# Patient Record
Sex: Male | Born: 1945 | Race: White | Hispanic: No | Marital: Married | Smoking: Never smoker
Health system: Southern US, Community
[De-identification: ages and names within clinical notes are randomized; demographics above are authoritative.]

---

## 2010-01-04 ENCOUNTER — Emergency Department (HOSPITAL_BASED_OUTPATIENT_CLINIC_OR_DEPARTMENT_OTHER): Admission: EM | Admit: 2010-01-04 | Discharge: 2010-01-04 | Payer: Self-pay | Admitting: Emergency Medicine

## 2010-01-04 ENCOUNTER — Ambulatory Visit: Payer: Self-pay | Admitting: Diagnostic Radiology

## 2010-01-09 ENCOUNTER — Emergency Department (HOSPITAL_BASED_OUTPATIENT_CLINIC_OR_DEPARTMENT_OTHER): Admission: EM | Admit: 2010-01-09 | Discharge: 2010-01-09 | Payer: Self-pay | Admitting: Emergency Medicine

## 2013-11-24 ENCOUNTER — Ambulatory Visit (INDEPENDENT_AMBULATORY_CARE_PROVIDER_SITE_OTHER): Payer: Medicare Other | Admitting: Sports Medicine

## 2013-11-24 ENCOUNTER — Encounter: Payer: Self-pay | Admitting: Sports Medicine

## 2013-11-24 ENCOUNTER — Ambulatory Visit (INDEPENDENT_AMBULATORY_CARE_PROVIDER_SITE_OTHER): Payer: Medicare Other

## 2013-11-24 VITALS — BP 110/73 | HR 63 | Ht 67.5 in | Wt 183.0 lb

## 2013-11-24 DIAGNOSIS — L03213 Periorbital cellulitis: Secondary | ICD-10-CM

## 2013-11-24 DIAGNOSIS — Z0000 Encounter for general adult medical examination without abnormal findings: Secondary | ICD-10-CM

## 2013-11-24 DIAGNOSIS — L03211 Cellulitis of face: Secondary | ICD-10-CM

## 2013-11-24 DIAGNOSIS — H00036 Abscess of eyelid left eye, unspecified eyelid: Secondary | ICD-10-CM

## 2013-11-24 DIAGNOSIS — H0520 Unspecified exophthalmos: Secondary | ICD-10-CM

## 2013-11-24 DIAGNOSIS — H269 Unspecified cataract: Secondary | ICD-10-CM | POA: Insufficient documentation

## 2013-11-24 DIAGNOSIS — R1030 Lower abdominal pain, unspecified: Secondary | ICD-10-CM

## 2013-11-24 DIAGNOSIS — N400 Enlarged prostate without lower urinary tract symptoms: Secondary | ICD-10-CM

## 2013-11-24 DIAGNOSIS — R1032 Left lower quadrant pain: Secondary | ICD-10-CM | POA: Insufficient documentation

## 2013-11-24 MED ORDER — DOXYCYCLINE HYCLATE 100 MG PO TABS: 100.0000 mg | ORAL_TABLET | Freq: Two times a day (BID) | ORAL | Status: AC

## 2013-11-24 NOTE — Assessment & Plan Note (Signed)
-   Continue Flomax 

## 2013-11-24 NOTE — Assessment & Plan Note (Signed)
Routine blood work ordered. Return in January for Medicare physical.

## 2013-11-24 NOTE — Assessment & Plan Note (Signed)
Vision is okay. There is only mild proptosis however we are still going to obtain a CT of the head to rule out orbital cellulitis. Doxycycline for 14 days. Return in 3 weeks.

## 2013-11-24 NOTE — Assessment & Plan Note (Signed)
Has an appointment in November for ophthalmology

## 2013-11-24 NOTE — Progress Notes (Signed)
  Subjective:    CC: Establish care.   HPI:  Left eye swelling: Present for a couple of weeks, slightly painful, no visual changes. It was slightly erythematous but now has started to resolve on its own.  BPH: Well controlled on Flomax.  Cataracts: Has an appointment scheduled with ophthalmology in November for excision.  Left groin pain: Unclear diagnosis, he has been on antibiotics in the past that resolved this. Initially he thought he had a hernia amenable to discussing this in further detail at a future visit.  Past medical history, Surgical history, Family history not pertinant except as noted below, Social history, Allergies, and medications have been entered into the medical record, reviewed, and no changes needed.   Review of Systems: No headache, visual changes, nausea, vomiting, diarrhea, constipation, dizziness, abdominal pain, skin rash, fevers, chills, night sweats, swollen lymph nodes, weight loss, chest pain, body aches, joint swelling, muscle aches, shortness of breath, mood changes, visual or auditory hallucinations.  Objective:    General: Well Developed, well nourished, and in no acute distress.  Neuro: Alert and oriented x3, extra-ocular muscles intact, sensation grossly intact.  HEENT: Normocephalic, atraumatic, pupils equal round reactive to light, neck supple, no masses, no lymphadenopathy, thyroid nonpalpable. There is only mild proptosis with essentially no erythema around the left eye, extraocular muscles intact, no pain on compression of the globe, vision is grossly intact. Skin: Warm and dry, no rashes noted.  Cardiac: Regular rate and rhythm, no murmurs rubs or gallops.  Respiratory: Clear to auscultation bilaterally. Not using accessory muscles, speaking in full sentences.  Abdominal: Soft, nontender, nondistended, positive bowel sounds, no masses, no organomegaly.  Musculoskeletal: Shoulder, elbow, wrist, hip, knee, ankle stable, and with full range of  motion.  Impression and Recommendations:    The patient was counselled, risk factors were discussed, anticipatory guidance given.

## 2013-11-24 NOTE — Assessment & Plan Note (Signed)
Recheck in 3 weeks.

## 2013-11-25 ENCOUNTER — Other Ambulatory Visit: Payer: Self-pay | Admitting: Sports Medicine

## 2013-11-26 LAB — COMPREHENSIVE METABOLIC PANEL
ALT: 22 U/L (ref 0–53)
Albumin: 4.6 g/dL (ref 3.5–5.2)
CO2: 25 mEq/L (ref 19–32)
Calcium: 9.1 mg/dL (ref 8.4–10.5)
Chloride: 106 mEq/L (ref 96–112)
Sodium: 140 mEq/L (ref 135–145)
Total Protein: 7.2 g/dL (ref 6.0–8.3)

## 2013-11-26 LAB — LIPID PANEL
Cholesterol: 140 mg/dL (ref 0–200)
HDL: 64 mg/dL (ref >39)
LDL Cholesterol: 66 mg/dL (ref 0–99)
Total CHOL/HDL Ratio: 2.2 ratio
Triglycerides: 49 mg/dL (ref <150)
VLDL: 10 mg/dL (ref 0–40)

## 2013-11-26 LAB — CBC WITH DIFFERENTIAL/PLATELET
Basophils Absolute: 0 K/uL (ref 0.0–0.1)
Basophils Relative: 0 % (ref 0–1)
Eosinophils Absolute: 0.2 K/uL (ref 0.0–0.7)
Eosinophils Relative: 4 % (ref 0–5)
HCT: 45.5 % (ref 39.0–52.0)
Hemoglobin: 15.5 g/dL (ref 13.0–17.0)
Lymphocytes Relative: 24 % (ref 12–46)
Lymphs Abs: 1.4 10*3/uL (ref 0.7–4.0)
MCH: 30.3 pg (ref 26.0–34.0)
MCHC: 34.1 g/dL (ref 30.0–36.0)
MCV: 89 fL (ref 78.0–100.0)
Monocytes Absolute: 0.6 K/uL (ref 0.1–1.0)
Monocytes Relative: 10 % (ref 3–12)
Neutro Abs: 3.6 K/uL (ref 1.7–7.7)
Neutrophils Relative %: 62 % (ref 43–77)
Platelets: 340 10*3/uL (ref 150–400)
RBC: 5.11 MIL/uL (ref 4.22–5.81)
RDW: 12.7 % (ref 11.5–15.5)
WBC: 5.8 10*3/uL (ref 4.0–10.5)

## 2013-11-26 LAB — COMPREHENSIVE METABOLIC PANEL WITH GFR
AST: 22 U/L (ref 0–37)
Alkaline Phosphatase: 56 U/L (ref 39–117)
BUN: 13 mg/dL (ref 6–23)
Creat: 1.04 mg/dL (ref 0.50–1.35)
Glucose, Bld: 92 mg/dL (ref 70–99)
Potassium: 4.4 meq/L (ref 3.5–5.3)
Total Bilirubin: 1.6 mg/dL — ABNORMAL HIGH (ref 0.2–1.2)

## 2013-11-26 LAB — TSH: TSH: 2.616 u[IU]/mL (ref 0.350–4.500)

## 2013-11-26 LAB — PSA, TOTAL AND FREE
PSA, Free Pct: 33 % (ref >25)
PSA, Free: 0.84 ng/mL
PSA: 2.58 ng/mL (ref <=4.00)

## 2013-11-26 LAB — HEMOGLOBIN A1C
Hgb A1c MFr Bld: 5.4 % (ref <5.7)
Mean Plasma Glucose: 108 mg/dL (ref <117)

## 2013-12-19 ENCOUNTER — Encounter: Payer: Self-pay | Admitting: Sports Medicine

## 2013-12-19 ENCOUNTER — Ambulatory Visit (INDEPENDENT_AMBULATORY_CARE_PROVIDER_SITE_OTHER): Payer: Medicare Other | Admitting: Sports Medicine

## 2013-12-19 VITALS — BP 116/70 | HR 76 | Ht 67.0 in | Wt 184.0 lb

## 2013-12-19 DIAGNOSIS — H00036 Abscess of eyelid left eye, unspecified eyelid: Secondary | ICD-10-CM | POA: Diagnosis not present

## 2013-12-19 DIAGNOSIS — Z0000 Encounter for general adult medical examination without abnormal findings: Secondary | ICD-10-CM

## 2013-12-19 DIAGNOSIS — R1030 Lower abdominal pain, unspecified: Secondary | ICD-10-CM | POA: Diagnosis not present

## 2013-12-19 DIAGNOSIS — L03213 Periorbital cellulitis: Secondary | ICD-10-CM

## 2013-12-19 DIAGNOSIS — R1032 Left lower quadrant pain: Secondary | ICD-10-CM

## 2013-12-19 NOTE — Assessment & Plan Note (Signed)
Doing very well. Labs are perfect. Return in January for Medicare physical.

## 2013-12-19 NOTE — Assessment & Plan Note (Signed)
Clinically resolved.  

## 2013-12-19 NOTE — Progress Notes (Signed)
  Subjective:    CC: Follow-up  HPI: Austin Farrell returns, he is up-to-date on all screening measures, he would like to go over his blood work today.  Left groin pain: Continuously resolving.  Preseptal cellulitis: Resolved.  Past medical history, Surgical history, Family history not pertinant except as noted below, Social history, Allergies, and medications have been entered into the medical record, reviewed, and no changes needed.   Review of Systems: No fevers, chills, night sweats, weight loss, chest pain, or shortness of breath.   Objective:    General: Well Developed, well nourished, and in no acute distress.  Neuro: Alert and oriented x3, extra-ocular muscles intact, sensation grossly intact.  HEENT: Normocephalic, atraumatic, pupils equal round reactive to light, neck supple, no masses, no lymphadenopathy, thyroid nonpalpable.  Skin: Warm and dry, no rashes. Cardiac: Regular rate and rhythm, no murmurs rubs or gallops, no lower extremity edema.  Respiratory: Clear to auscultation bilaterally. Not using accessory muscles, speaking in full sentences.  Impression and Recommendations:    I spent 25 minutes with this patient, greater than 50% was face-to-face time counseling regarding the above diagnoses, we went over all of his blood work in detail.

## 2013-12-19 NOTE — Assessment & Plan Note (Signed)
Resolving

## 2014-03-12 DIAGNOSIS — L309 Dermatitis, unspecified: Secondary | ICD-10-CM | POA: Diagnosis not present

## 2014-06-01 DIAGNOSIS — M9902 Segmental and somatic dysfunction of thoracic region: Secondary | ICD-10-CM | POA: Diagnosis not present

## 2014-06-01 DIAGNOSIS — M9903 Segmental and somatic dysfunction of lumbar region: Secondary | ICD-10-CM | POA: Diagnosis not present

## 2014-06-01 DIAGNOSIS — M546 Pain in thoracic spine: Secondary | ICD-10-CM | POA: Diagnosis not present

## 2014-06-01 DIAGNOSIS — M542 Cervicalgia: Secondary | ICD-10-CM | POA: Diagnosis not present

## 2014-06-01 DIAGNOSIS — M9901 Segmental and somatic dysfunction of cervical region: Secondary | ICD-10-CM | POA: Diagnosis not present

## 2014-06-01 DIAGNOSIS — M5441 Lumbago with sciatica, right side: Secondary | ICD-10-CM | POA: Diagnosis not present

## 2014-07-10 DIAGNOSIS — H35373 Puckering of macula, bilateral: Secondary | ICD-10-CM | POA: Diagnosis not present

## 2014-07-10 DIAGNOSIS — H02832 Dermatochalasis of right lower eyelid: Secondary | ICD-10-CM | POA: Diagnosis not present

## 2014-07-10 DIAGNOSIS — H11823 Conjunctivochalasis, bilateral: Secondary | ICD-10-CM | POA: Diagnosis not present

## 2014-07-10 DIAGNOSIS — H02834 Dermatochalasis of left upper eyelid: Secondary | ICD-10-CM | POA: Diagnosis not present

## 2014-07-10 DIAGNOSIS — H25813 Combined forms of age-related cataract, bilateral: Secondary | ICD-10-CM | POA: Diagnosis not present

## 2014-07-10 DIAGNOSIS — H02835 Dermatochalasis of left lower eyelid: Secondary | ICD-10-CM | POA: Diagnosis not present

## 2014-07-10 DIAGNOSIS — H02831 Dermatochalasis of right upper eyelid: Secondary | ICD-10-CM | POA: Diagnosis not present

## 2014-07-10 DIAGNOSIS — H43813 Vitreous degeneration, bilateral: Secondary | ICD-10-CM | POA: Diagnosis not present

## 2014-08-05 DIAGNOSIS — H2513 Age-related nuclear cataract, bilateral: Secondary | ICD-10-CM | POA: Diagnosis not present

## 2014-08-05 DIAGNOSIS — H35373 Puckering of macula, bilateral: Secondary | ICD-10-CM | POA: Diagnosis not present

## 2014-08-05 DIAGNOSIS — H25013 Cortical age-related cataract, bilateral: Secondary | ICD-10-CM | POA: Diagnosis not present

## 2014-08-20 DIAGNOSIS — R5381 Other malaise: Secondary | ICD-10-CM | POA: Diagnosis not present

## 2014-08-20 DIAGNOSIS — J309 Allergic rhinitis, unspecified: Secondary | ICD-10-CM | POA: Diagnosis not present

## 2014-08-20 DIAGNOSIS — Z0000 Encounter for general adult medical examination without abnormal findings: Secondary | ICD-10-CM | POA: Diagnosis not present

## 2014-08-20 DIAGNOSIS — N400 Enlarged prostate without lower urinary tract symptoms: Secondary | ICD-10-CM | POA: Diagnosis not present

## 2014-08-20 DIAGNOSIS — N529 Male erectile dysfunction, unspecified: Secondary | ICD-10-CM | POA: Diagnosis not present

## 2014-08-20 DIAGNOSIS — Z23 Encounter for immunization: Secondary | ICD-10-CM | POA: Diagnosis not present

## 2014-08-20 DIAGNOSIS — L723 Sebaceous cyst: Secondary | ICD-10-CM | POA: Diagnosis not present

## 2014-08-20 DIAGNOSIS — H6980 Other specified disorders of Eustachian tube, unspecified ear: Secondary | ICD-10-CM | POA: Diagnosis not present

## 2014-08-25 DIAGNOSIS — H25811 Combined forms of age-related cataract, right eye: Secondary | ICD-10-CM | POA: Diagnosis not present

## 2014-08-25 DIAGNOSIS — H25011 Cortical age-related cataract, right eye: Secondary | ICD-10-CM | POA: Diagnosis not present

## 2014-08-25 DIAGNOSIS — H2511 Age-related nuclear cataract, right eye: Secondary | ICD-10-CM | POA: Diagnosis not present

## 2014-09-12 DIAGNOSIS — S01112A Laceration without foreign body of left eyelid and periocular area, initial encounter: Secondary | ICD-10-CM | POA: Diagnosis not present

## 2014-09-12 DIAGNOSIS — S098XXA Other specified injuries of head, initial encounter: Secondary | ICD-10-CM | POA: Diagnosis not present

## 2014-09-12 DIAGNOSIS — Z23 Encounter for immunization: Secondary | ICD-10-CM | POA: Diagnosis not present

## 2014-09-12 DIAGNOSIS — S0990XA Unspecified injury of head, initial encounter: Secondary | ICD-10-CM | POA: Diagnosis not present

## 2014-09-12 DIAGNOSIS — S0181XA Laceration without foreign body of other part of head, initial encounter: Secondary | ICD-10-CM | POA: Diagnosis not present

## 2014-09-12 DIAGNOSIS — R51 Headache: Secondary | ICD-10-CM | POA: Diagnosis not present

## 2014-09-17 DIAGNOSIS — Z4802 Encounter for removal of sutures: Secondary | ICD-10-CM | POA: Diagnosis not present

## 2014-09-17 DIAGNOSIS — S0181XA Laceration without foreign body of other part of head, initial encounter: Secondary | ICD-10-CM | POA: Diagnosis not present

## 2014-09-22 DIAGNOSIS — H25812 Combined forms of age-related cataract, left eye: Secondary | ICD-10-CM | POA: Diagnosis not present

## 2014-09-22 DIAGNOSIS — H25012 Cortical age-related cataract, left eye: Secondary | ICD-10-CM | POA: Diagnosis not present

## 2014-09-22 DIAGNOSIS — H2512 Age-related nuclear cataract, left eye: Secondary | ICD-10-CM | POA: Diagnosis not present

## 2014-11-17 DIAGNOSIS — H02831 Dermatochalasis of right upper eyelid: Secondary | ICD-10-CM | POA: Diagnosis not present

## 2014-11-17 DIAGNOSIS — Z961 Presence of intraocular lens: Secondary | ICD-10-CM | POA: Diagnosis not present

## 2014-11-17 DIAGNOSIS — H11823 Conjunctivochalasis, bilateral: Secondary | ICD-10-CM | POA: Diagnosis not present

## 2014-11-17 DIAGNOSIS — H02832 Dermatochalasis of right lower eyelid: Secondary | ICD-10-CM | POA: Diagnosis not present

## 2014-11-17 DIAGNOSIS — H43813 Vitreous degeneration, bilateral: Secondary | ICD-10-CM | POA: Diagnosis not present

## 2014-11-17 DIAGNOSIS — H02834 Dermatochalasis of left upper eyelid: Secondary | ICD-10-CM | POA: Diagnosis not present

## 2014-11-17 DIAGNOSIS — H02403 Unspecified ptosis of bilateral eyelids: Secondary | ICD-10-CM | POA: Diagnosis not present

## 2014-11-17 DIAGNOSIS — H35373 Puckering of macula, bilateral: Secondary | ICD-10-CM | POA: Diagnosis not present

## 2014-11-17 DIAGNOSIS — H02835 Dermatochalasis of left lower eyelid: Secondary | ICD-10-CM | POA: Diagnosis not present

## 2014-11-30 DIAGNOSIS — Z23 Encounter for immunization: Secondary | ICD-10-CM | POA: Diagnosis not present

## 2015-01-25 DIAGNOSIS — Z7982 Long term (current) use of aspirin: Secondary | ICD-10-CM | POA: Diagnosis not present

## 2015-01-25 DIAGNOSIS — Z961 Presence of intraocular lens: Secondary | ICD-10-CM | POA: Diagnosis not present

## 2015-01-25 DIAGNOSIS — Z7951 Long term (current) use of inhaled steroids: Secondary | ICD-10-CM | POA: Diagnosis not present

## 2015-01-25 DIAGNOSIS — N400 Enlarged prostate without lower urinary tract symptoms: Secondary | ICD-10-CM | POA: Diagnosis not present

## 2015-01-25 DIAGNOSIS — Z9841 Cataract extraction status, right eye: Secondary | ICD-10-CM | POA: Diagnosis not present

## 2015-01-25 DIAGNOSIS — L308 Other specified dermatitis: Secondary | ICD-10-CM | POA: Diagnosis not present

## 2015-01-25 DIAGNOSIS — Z9842 Cataract extraction status, left eye: Secondary | ICD-10-CM | POA: Diagnosis not present

## 2015-01-25 DIAGNOSIS — H35371 Puckering of macula, right eye: Secondary | ICD-10-CM | POA: Diagnosis not present

## 2015-01-25 DIAGNOSIS — J302 Other seasonal allergic rhinitis: Secondary | ICD-10-CM | POA: Diagnosis not present

## 2015-01-25 DIAGNOSIS — Z79899 Other long term (current) drug therapy: Secondary | ICD-10-CM | POA: Diagnosis not present

## 2015-01-26 DIAGNOSIS — Z9889 Other specified postprocedural states: Secondary | ICD-10-CM | POA: Diagnosis not present

## 2015-01-26 DIAGNOSIS — Z961 Presence of intraocular lens: Secondary | ICD-10-CM | POA: Diagnosis not present

## 2015-01-26 DIAGNOSIS — H35373 Puckering of macula, bilateral: Secondary | ICD-10-CM | POA: Diagnosis not present

## 2015-02-02 DIAGNOSIS — H02836 Dermatochalasis of left eye, unspecified eyelid: Secondary | ICD-10-CM | POA: Diagnosis not present

## 2015-02-02 DIAGNOSIS — Z961 Presence of intraocular lens: Secondary | ICD-10-CM | POA: Diagnosis not present

## 2015-02-02 DIAGNOSIS — H11823 Conjunctivochalasis, bilateral: Secondary | ICD-10-CM | POA: Diagnosis not present

## 2015-02-02 DIAGNOSIS — H02833 Dermatochalasis of right eye, unspecified eyelid: Secondary | ICD-10-CM | POA: Diagnosis not present

## 2015-02-02 DIAGNOSIS — H02403 Unspecified ptosis of bilateral eyelids: Secondary | ICD-10-CM | POA: Diagnosis not present

## 2015-02-02 DIAGNOSIS — H35373 Puckering of macula, bilateral: Secondary | ICD-10-CM | POA: Diagnosis not present

## 2015-02-02 DIAGNOSIS — H43393 Other vitreous opacities, bilateral: Secondary | ICD-10-CM | POA: Diagnosis not present

## 2015-02-08 DIAGNOSIS — K1121 Acute sialoadenitis: Secondary | ICD-10-CM | POA: Diagnosis not present

## 2015-03-12 DIAGNOSIS — D229 Melanocytic nevi, unspecified: Secondary | ICD-10-CM | POA: Diagnosis not present

## 2015-03-12 DIAGNOSIS — J069 Acute upper respiratory infection, unspecified: Secondary | ICD-10-CM | POA: Diagnosis not present

## 2015-03-16 DIAGNOSIS — H43393 Other vitreous opacities, bilateral: Secondary | ICD-10-CM | POA: Diagnosis not present

## 2015-03-16 DIAGNOSIS — H02833 Dermatochalasis of right eye, unspecified eyelid: Secondary | ICD-10-CM | POA: Diagnosis not present

## 2015-03-16 DIAGNOSIS — H02836 Dermatochalasis of left eye, unspecified eyelid: Secondary | ICD-10-CM | POA: Diagnosis not present

## 2015-03-16 DIAGNOSIS — Z961 Presence of intraocular lens: Secondary | ICD-10-CM | POA: Diagnosis not present

## 2015-03-16 DIAGNOSIS — Z48810 Encounter for surgical aftercare following surgery on the sense organs: Secondary | ICD-10-CM | POA: Diagnosis not present

## 2015-03-16 DIAGNOSIS — H35373 Puckering of macula, bilateral: Secondary | ICD-10-CM | POA: Diagnosis not present

## 2015-03-16 DIAGNOSIS — H02403 Unspecified ptosis of bilateral eyelids: Secondary | ICD-10-CM | POA: Diagnosis not present

## 2015-03-16 DIAGNOSIS — H11823 Conjunctivochalasis, bilateral: Secondary | ICD-10-CM | POA: Diagnosis not present

## 2015-03-24 DIAGNOSIS — D1801 Hemangioma of skin and subcutaneous tissue: Secondary | ICD-10-CM | POA: Diagnosis not present

## 2015-03-24 DIAGNOSIS — L820 Inflamed seborrheic keratosis: Secondary | ICD-10-CM | POA: Diagnosis not present

## 2015-07-13 DIAGNOSIS — H02832 Dermatochalasis of right lower eyelid: Secondary | ICD-10-CM | POA: Diagnosis not present

## 2015-07-13 DIAGNOSIS — Z961 Presence of intraocular lens: Secondary | ICD-10-CM | POA: Diagnosis not present

## 2015-07-13 DIAGNOSIS — H02834 Dermatochalasis of left upper eyelid: Secondary | ICD-10-CM | POA: Diagnosis not present

## 2015-07-13 DIAGNOSIS — H11822 Conjunctivochalasis, left eye: Secondary | ICD-10-CM | POA: Diagnosis not present

## 2015-07-13 DIAGNOSIS — H02835 Dermatochalasis of left lower eyelid: Secondary | ICD-10-CM | POA: Diagnosis not present

## 2015-07-13 DIAGNOSIS — H0289 Other specified disorders of eyelid: Secondary | ICD-10-CM | POA: Diagnosis not present

## 2015-07-13 DIAGNOSIS — H35373 Puckering of macula, bilateral: Secondary | ICD-10-CM | POA: Diagnosis not present

## 2015-07-13 DIAGNOSIS — H43392 Other vitreous opacities, left eye: Secondary | ICD-10-CM | POA: Diagnosis not present

## 2015-07-13 DIAGNOSIS — H02831 Dermatochalasis of right upper eyelid: Secondary | ICD-10-CM | POA: Diagnosis not present

## 2015-10-18 ENCOUNTER — Encounter: Payer: Self-pay | Admitting: Sports Medicine

## 2015-10-18 ENCOUNTER — Ambulatory Visit (INDEPENDENT_AMBULATORY_CARE_PROVIDER_SITE_OTHER): Payer: Medicare Other | Admitting: Sports Medicine

## 2015-10-18 VITALS — BP 111/70 | HR 69 | Resp 16 | Ht 66.5 in | Wt 183.0 lb

## 2015-10-18 DIAGNOSIS — R682 Dry mouth, unspecified: Secondary | ICD-10-CM | POA: Diagnosis not present

## 2015-10-18 DIAGNOSIS — Z23 Encounter for immunization: Secondary | ICD-10-CM | POA: Diagnosis not present

## 2015-10-18 DIAGNOSIS — Z0000 Encounter for general adult medical examination without abnormal findings: Secondary | ICD-10-CM

## 2015-10-18 DIAGNOSIS — N400 Enlarged prostate without lower urinary tract symptoms: Secondary | ICD-10-CM

## 2015-10-18 DIAGNOSIS — L989 Disorder of the skin and subcutaneous tissue, unspecified: Secondary | ICD-10-CM | POA: Insufficient documentation

## 2015-10-18 LAB — LIPID PANEL
Cholesterol: 147 mg/dL (ref 125–200)
HDL: 57 mg/dL (ref >=40)
LDL Cholesterol: 80 mg/dL (ref <130)
Total CHOL/HDL Ratio: 2.6 Ratio (ref <=5.0)
Triglycerides: 48 mg/dL (ref <150)
VLDL: 10 mg/dL (ref <30)

## 2015-10-18 LAB — CBC
HCT: 46.4 % (ref 38.5–50.0)
Hemoglobin: 15.7 g/dL (ref 13.2–17.1)
MCH: 30.5 pg (ref 27.0–33.0)
MCHC: 33.8 g/dL (ref 32.0–36.0)
MCV: 90.1 fL (ref 80.0–100.0)
MPV: 9.4 fL (ref 7.5–12.5)
Platelets: 347 10*3/uL (ref 140–400)
RBC: 5.15 MIL/uL (ref 4.20–5.80)
RDW: 13.1 % (ref 11.0–15.0)
WBC: 6.2 K/uL (ref 3.8–10.8)

## 2015-10-18 LAB — COMPREHENSIVE METABOLIC PANEL
ALT: 24 U/L (ref 9–46)
AST: 20 U/L (ref 10–35)
Alkaline Phosphatase: 57 U/L (ref 40–115)
BUN: 15 mg/dL (ref 7–25)
Calcium: 9 mg/dL (ref 8.6–10.3)
Potassium: 4.3 mmol/L (ref 3.5–5.3)
Total Bilirubin: 2 mg/dL — ABNORMAL HIGH (ref 0.2–1.2)
Total Protein: 6.9 g/dL (ref 6.1–8.1)

## 2015-10-18 LAB — COMPREHENSIVE METABOLIC PANEL WITH GFR
Albumin: 4.5 g/dL (ref 3.6–5.1)
CO2: 23 mmol/L (ref 20–31)
Chloride: 105 mmol/L (ref 98–110)
Creat: 1.15 mg/dL (ref 0.70–1.18)
Glucose, Bld: 87 mg/dL (ref 65–99)
Sodium: 140 mmol/L (ref 135–146)

## 2015-10-18 LAB — HIV ANTIBODY (ROUTINE TESTING W REFLEX): HIV 1&2 Ab, 4th Generation: NONREACTIVE

## 2015-10-18 MED ORDER — FINASTERIDE 5 MG PO TABS: 5.0000 mg | ORAL_TABLET | Freq: Every day | ORAL | 3 refills | Status: AC

## 2015-10-18 MED ORDER — TAMSULOSIN HCL 0.4 MG PO CAPS: 0.8000 mg | ORAL_CAPSULE | Freq: Every day | ORAL | 3 refills | Status: AC

## 2015-10-18 NOTE — Assessment & Plan Note (Signed)
Increasing Flomax to full dose, adding finasteride

## 2015-10-18 NOTE — Assessment & Plan Note (Signed)
Medicare physical as above, routine blood work ordered. 

## 2015-10-18 NOTE — Assessment & Plan Note (Signed)
Suspect idiopathic and age related, he does have a history of what sounds to be parotitis. Adding Sjogren's antibodies.

## 2015-10-18 NOTE — Assessment & Plan Note (Signed)
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 Return for shave biopsy and hyfrecation.

## 2015-10-18 NOTE — Progress Notes (Signed)
Subjective:    Austin Farrell is a 70 y.o. male who presents for Medicare Annual/Subsequent preventive examination.   Preventive Screening-Counseling & Management  Tobacco History  Smoking Status  . Never Smoker  Smokeless Tobacco  . Not on file    Problems Prior to Visit 1.   Current Problems (verified) Patient Active Problem List   Diagnosis Date Noted  . Preseptal cellulitis of left eye 11/24/2013  . Left groin pain 11/24/2013  . Annual physical exam 11/24/2013  . Cataracts, bilateral 11/24/2013  . BPH (benign prostatic hyperplasia) 11/24/2013    Medications Prior to Visit Current Outpatient Prescriptions on File Prior to Visit  Medication Sig Dispense Refill  . FLUTICASONE PROPIONATE  HFA IN Inhale into the lungs.    . tamsulosin (FLOMAX) 0.4 MG CAPS capsule Take 0.4 mg by mouth.     No current facility-administered medications on file prior to visit.     Current Medications (verified) Current Outpatient Prescriptions  Medication Sig Dispense Refill  . FLUTICASONE PROPIONATE  HFA IN Inhale into the lungs.    . tamsulosin (FLOMAX) 0.4 MG CAPS capsule Take 0.4 mg by mouth.     No current facility-administered medications for this visit.      Allergies (verified) Review of patient's allergies indicates no known allergies.   PAST HISTORY  Family History Family History  Problem Relation Age of Onset  . Stroke Mother   . Cancer Father     prostate    Social History Social History  Substance Use Topics  . Smoking status: Never Smoker  . Smokeless tobacco: Not on file  . Alcohol use Not on file    Are there smokers in your home (other than you)?  No  Risk Factors Current exercise habits: The patient does not participate in regular exercise at present.  Dietary issues discussed: No    Cardiac risk factors: advanced age (older than 64 for men, 23 for women).  Depression Screen (Note: if answer to either of the following is "Yes", a more  complete depression screening is indicated)   Q1: Over the past two weeks, have you felt down, depressed or hopeless? No  Q2: Over the past two weeks, have you felt little interest or pleasure in doing things? No  Have you lost interest or pleasure in daily life? No  Do you often feel hopeless? No  Do you cry easily over simple problems? No  Activities of Daily Living In your present state of health, do you have any difficulty performing the following activities?:  Driving? No Managing money?  No Feeding yourself? No Getting from bed to chair? No Climbing a flight of stairs? No Preparing food and eating?: No Bathing or showering? No Getting dressed: No Getting to the toilet? No Using the toilet:No Moving around from place to place: No In the past year have you fallen or had a near fall?:No   Are you sexually active?  No  Do you have more than one partner?  No  Hearing Difficulties: No Do you often ask people to speak up or repeat themselves? No Do you experience ringing or noises in your ears? No Do you have difficulty understanding soft or whispered voices? No   Do you feel that you have a problem with memory? No  Do you often misplace items? No  Do you feel safe at home?  Yes  Cognitive Testing  Alert? Yes  Normal Appearance?Yes  Oriented to person? Yes  Place? Yes   Time?  Yes  Recall of three objects?  Yes  Can perform simple calculations? Yes  Displays appropriate judgment?Yes  Can read the correct time from a watch face?Yes   Advanced Directives have been discussed with the patient? Yes   List the Names of Other Physician/Practitioners you currently use: 1.    Indicate any recent Medical Services you may have received from other than [facility] providers in the past year (date may be approximate).   There is no immunization history on file for this patient.  Screening Tests Health Maintenance  Topic Date Due  . Hepatitis C Screening  Aug 23, 1945  . TETANUS/TDAP   02/25/1964  . ZOSTAVAX  02/24/2005  . PNA vac Low Risk Adult (1 of 2 - PCV13) 02/24/2010  . INFLUENZA VACCINE  09/21/2015  . COLONOSCOPY  02/21/2020    All answers were reviewed with the patient and necessary referrals were made:  Aundria Mems, MD   10/18/2015   History reviewed: allergies, current medications, past family history, past medical history, past social history, past surgical history and problem list  Review of Systems A comprehensive review of systems was negative.    Objective:   Blood pressure 111/70, pulse 69, resp. rate 16, height 5' 6.5" (1.689 m), weight 183 lb (83 kg). Body mass index is 29.09 kg/m. General: Well Developed, well nourished, and in no acute distress.  Neuro: Alert and oriented x3, extra-ocular muscles intact, sensation grossly intact. Cranial nerves II through XII are intact, motor, sensory, and coordinative functions are all intact. HEENT: Normocephalic, atraumatic, pupils equal round reactive to light, neck supple, no masses, no lymphadenopathy, thyroid nonpalpable. Oropharynx, nasopharynx, external ear canals are unremarkable. Skin: Warm and dry, no rashes noted.  Cardiac: Regular rate and rhythm, no murmurs rubs or gallops.  Respiratory: Clear to auscultation bilaterally. Not using accessory muscles, speaking in full sentences.  Abdominal: Soft, nontender, nondistended, positive bowel sounds, no masses, no organomegaly.  Musculoskeletal: Shoulder, elbow, wrist, hip, knee, ankle stable, and with full range of motion.   Assessment:     Healthy male     Plan:     During the course of the visit the patient was educated and counseled about appropriate screening and preventive services including:    Pneumococcal vaccine   Td vaccine  Diet review for nutrition referral? Yes ____  Not Indicated __x__   Patient Instructions (the written plan) was given to the patient.  Medicare Attestation I have personally reviewed: The  patient's medical and social history Their use of alcohol, tobacco or illicit drugs Their current medications and supplements The patient's functional ability including ADLs,fall risks, home safety risks, cognitive, and hearing and visual impairment Diet and physical activities Evidence for depression or mood disorders  The patient's weight, height, BMI, and visual acuity have been recorded in the chart.  I have made referrals, counseling, and provided education to the patient based on review of the above and I have provided the patient with a written personalized care plan for preventive services.     Aundria Mems, MD   10/18/2015

## 2015-10-19 ENCOUNTER — Other Ambulatory Visit: Payer: Self-pay

## 2015-10-19 LAB — SJOGRENS SYNDROME-B EXTRACTABLE NUCLEAR ANTIBODY: SSB (La) (ENA) Antibody, IgG: <1

## 2015-10-19 LAB — SJOGRENS SYNDROME-A EXTRACTABLE NUCLEAR ANTIBODY: SSA (Ro) (ENA) Antibody, IgG: <1

## 2015-10-19 LAB — HEPATITIS C ANTIBODY: HCV Ab: NEGATIVE

## 2015-10-19 MED ORDER — FLUTICASONE PROPIONATE 50 MCG/ACT NA SUSP: 2.0000 | Freq: Every day | NASAL | 6 refills | Status: DC

## 2015-10-22 ENCOUNTER — Ambulatory Visit (INDEPENDENT_AMBULATORY_CARE_PROVIDER_SITE_OTHER): Payer: Medicare Other | Admitting: Sports Medicine

## 2015-10-22 DIAGNOSIS — L821 Other seborrheic keratosis: Secondary | ICD-10-CM | POA: Diagnosis not present

## 2015-10-22 DIAGNOSIS — L989 Disorder of the skin and subcutaneous tissue, unspecified: Secondary | ICD-10-CM | POA: Diagnosis not present

## 2015-10-22 NOTE — Assessment & Plan Note (Signed)
Shave biopsy as above. Return for a wound check in one week.

## 2015-10-22 NOTE — Addendum Note (Signed)
Addended by: Elizabeth Sauer on: 10/22/2015 11:49 AM   Modules accepted: Orders

## 2015-10-22 NOTE — Progress Notes (Signed)
  Procedure:  Excision of  nose pigmented skin lesion Risks, benefits, and alternatives explained and consent obtained. Time out conducted. Surface prepped with alcohol. 1cc lidocaine with epinephine infiltrated in a field block. Adequate anesthesia ensured. Area prepped and draped in a sterile fashion. Excision performed with: Small shave biopsy obtained and hemostasis achieved with electrocautery Hemostasis achieved. Pt stable.

## 2015-10-29 ENCOUNTER — Ambulatory Visit: Payer: Medicare Other | Admitting: Sports Medicine

## 2015-10-29 DIAGNOSIS — H26491 Other secondary cataract, right eye: Secondary | ICD-10-CM | POA: Diagnosis not present

## 2015-10-29 DIAGNOSIS — H5202 Hypermetropia, left eye: Secondary | ICD-10-CM | POA: Diagnosis not present

## 2015-10-29 DIAGNOSIS — H35373 Puckering of macula, bilateral: Secondary | ICD-10-CM | POA: Diagnosis not present

## 2015-10-29 DIAGNOSIS — Z961 Presence of intraocular lens: Secondary | ICD-10-CM | POA: Diagnosis not present

## 2016-02-24 DIAGNOSIS — J018 Other acute sinusitis: Secondary | ICD-10-CM | POA: Diagnosis not present

## 2016-03-28 DIAGNOSIS — Z961 Presence of intraocular lens: Secondary | ICD-10-CM | POA: Diagnosis not present

## 2016-03-28 DIAGNOSIS — H35372 Puckering of macula, left eye: Secondary | ICD-10-CM | POA: Diagnosis not present

## 2016-03-28 DIAGNOSIS — H02403 Unspecified ptosis of bilateral eyelids: Secondary | ICD-10-CM | POA: Diagnosis not present

## 2016-03-28 DIAGNOSIS — H11822 Conjunctivochalasis, left eye: Secondary | ICD-10-CM | POA: Diagnosis not present

## 2016-03-28 DIAGNOSIS — H35373 Puckering of macula, bilateral: Secondary | ICD-10-CM | POA: Diagnosis not present

## 2016-03-28 DIAGNOSIS — Z9889 Other specified postprocedural states: Secondary | ICD-10-CM | POA: Diagnosis not present

## 2016-03-28 DIAGNOSIS — H43812 Vitreous degeneration, left eye: Secondary | ICD-10-CM | POA: Diagnosis not present

## 2016-03-28 DIAGNOSIS — Z794 Long term (current) use of insulin: Secondary | ICD-10-CM | POA: Diagnosis not present

## 2016-03-28 DIAGNOSIS — E113532 Type 2 diabetes mellitus with proliferative diabetic retinopathy with traction retinal detachment not involving the macula, left eye: Secondary | ICD-10-CM | POA: Diagnosis not present

## 2016-03-28 DIAGNOSIS — Z7982 Long term (current) use of aspirin: Secondary | ICD-10-CM | POA: Diagnosis not present

## 2016-03-28 DIAGNOSIS — H43392 Other vitreous opacities, left eye: Secondary | ICD-10-CM | POA: Diagnosis not present

## 2016-03-28 DIAGNOSIS — H26493 Other secondary cataract, bilateral: Secondary | ICD-10-CM | POA: Diagnosis not present

## 2016-03-28 DIAGNOSIS — Z79899 Other long term (current) drug therapy: Secondary | ICD-10-CM | POA: Diagnosis not present

## 2016-10-02 DIAGNOSIS — H6981 Other specified disorders of Eustachian tube, right ear: Secondary | ICD-10-CM | POA: Diagnosis not present

## 2016-10-02 DIAGNOSIS — R0981 Nasal congestion: Secondary | ICD-10-CM | POA: Diagnosis not present

## 2016-10-02 DIAGNOSIS — H9201 Otalgia, right ear: Secondary | ICD-10-CM | POA: Diagnosis not present

## 2016-10-02 DIAGNOSIS — K0889 Other specified disorders of teeth and supporting structures: Secondary | ICD-10-CM | POA: Diagnosis not present

## 2016-12-21 DIAGNOSIS — N451 Epididymitis: Secondary | ICD-10-CM | POA: Diagnosis not present

## 2016-12-21 DIAGNOSIS — Z23 Encounter for immunization: Secondary | ICD-10-CM | POA: Diagnosis not present

## 2016-12-21 DIAGNOSIS — R300 Dysuria: Secondary | ICD-10-CM | POA: Diagnosis not present

## 2017-02-02 ENCOUNTER — Other Ambulatory Visit: Payer: Self-pay | Admitting: Sports Medicine

## 2017-02-24 ENCOUNTER — Encounter (HOSPITAL_BASED_OUTPATIENT_CLINIC_OR_DEPARTMENT_OTHER): Payer: Self-pay | Admitting: Emergency Medicine

## 2017-02-24 ENCOUNTER — Other Ambulatory Visit: Payer: Self-pay

## 2017-02-24 ENCOUNTER — Emergency Department (HOSPITAL_BASED_OUTPATIENT_CLINIC_OR_DEPARTMENT_OTHER)
Admission: EM | Admit: 2017-02-24 | Discharge: 2017-02-24 | Disposition: A | Discharge status: Home / Self Care | Payer: Medicare Other | Attending: Emergency Medicine | Admitting: Emergency Medicine

## 2017-02-24 ENCOUNTER — Emergency Department (HOSPITAL_BASED_OUTPATIENT_CLINIC_OR_DEPARTMENT_OTHER): Payer: Medicare Other

## 2017-02-24 DIAGNOSIS — Y92017 Garden or yard in single-family (private) house as the place of occurrence of the external cause: Secondary | ICD-10-CM | POA: Insufficient documentation

## 2017-02-24 DIAGNOSIS — S0083XA Contusion of other part of head, initial encounter: Secondary | ICD-10-CM | POA: Diagnosis not present

## 2017-02-24 DIAGNOSIS — Y9301 Activity, walking, marching and hiking: Secondary | ICD-10-CM | POA: Insufficient documentation

## 2017-02-24 DIAGNOSIS — S0993XA Unspecified injury of face, initial encounter: Secondary | ICD-10-CM | POA: Diagnosis not present

## 2017-02-24 DIAGNOSIS — Y999 Unspecified external cause status: Secondary | ICD-10-CM | POA: Insufficient documentation

## 2017-02-24 DIAGNOSIS — S022XXA Fracture of nasal bones, initial encounter for closed fracture: Secondary | ICD-10-CM | POA: Diagnosis not present

## 2017-02-24 DIAGNOSIS — S199XXA Unspecified injury of neck, initial encounter: Secondary | ICD-10-CM | POA: Diagnosis not present

## 2017-02-24 DIAGNOSIS — Z79899 Other long term (current) drug therapy: Secondary | ICD-10-CM | POA: Insufficient documentation

## 2017-02-24 DIAGNOSIS — W19XXXA Unspecified fall, initial encounter: Secondary | ICD-10-CM

## 2017-02-24 DIAGNOSIS — R51 Headache: Secondary | ICD-10-CM | POA: Diagnosis not present

## 2017-02-24 DIAGNOSIS — W010XXA Fall on same level from slipping, tripping and stumbling without subsequent striking against object, initial encounter: Secondary | ICD-10-CM | POA: Diagnosis not present

## 2017-02-24 DIAGNOSIS — S0990XA Unspecified injury of head, initial encounter: Secondary | ICD-10-CM | POA: Diagnosis not present

## 2017-02-24 MED ORDER — TETANUS-DIPHTH-ACELL PERTUSSIS 5-2.5-18.5 LF-MCG/0.5 IM SUSP: 0.5000 mL | Freq: Once | INTRAMUSCULAR | Status: DC

## 2017-02-24 NOTE — ED Provider Notes (Signed)
[facility] Provider Note   CSN: [id number] Arrival date & time: 02/24/17  1010     History   Chief Complaint Chief Complaint  Patient presents with  . Fall    HPI Bricyn Labrada is a 72 y.o. male.  The history is provided by the patient and a relative.  Fall  This is a new (pt was walking outside his home speaking with tree trimmers when he missed a step and fell flat on his face on the concrete) problem. The current episode started 1 to 2 hours ago. The problem occurs constantly. The problem has not changed since onset.Associated symptoms comments: Facial pain and nose pain.  Bleeding from the nose.  No LOC and not on anticoagulation.  No chest/abd or hip pain.  Able to walk without problems.  Some neck pain but states chronic. Nothing aggravates the symptoms. Nothing relieves the symptoms. He has tried nothing for the symptoms.    History reviewed. No pertinent past medical history.  Patient Active Problem List   Diagnosis Date Noted  . Dry mouth 10/18/2015  . Skin lesion 10/18/2015  . Annual physical exam 11/24/2013  . Cataracts, bilateral 11/24/2013  . BPH (benign prostatic hyperplasia) 11/24/2013    History reviewed. No pertinent surgical history.     Home Medications    Prior to Admission medications   Medication Sig Start Date End Date Taking? Authorizing Provider  finasteride (PROSCAR) 5 MG tablet Take 1 tablet (5 mg total) by mouth daily. 10/18/15   Silverio Decamp, MD  fluticasone Asencion Islam) 50 MCG/ACT nasal spray TWO SPRAYS INTO EACH NOSTRIL DAILY 02/02/17   Silverio Decamp, MD  FLUTICASONE PROPIONATE  HFA IN Inhale into the lungs.    [provider]  tamsulosin (FLOMAX) 0.4 MG CAPS capsule Take 2 capsules (0.8 mg total) by mouth daily. 10/18/15   Silverio Decamp, MD    Family History Family History  Problem Relation Age of Onset  . Stroke Mother   . Cancer Father        prostate    Social  History Social History   Tobacco Use  . Smoking status: Never Smoker  . Smokeless tobacco: Never Used  Substance Use Topics  . Alcohol use: No    Frequency: Never  . Drug use: Not on file     Allergies   Patient has no known allergies.   Review of Systems Review of Systems  All other systems reviewed and are negative.    Physical Exam Updated Vital Signs BP 120/65 (BP Location: Left Arm)   Pulse 60   Temp 98.7 F (37.1 C) (Oral)   Resp 16   Ht 5\' 8"  (1.727 m)   Wt 81.6 kg (180 lb)   SpO2 100%   BMI 27.37 kg/m   Physical Exam  Constitutional: He is oriented to person, place, and time. He appears well-developed and well-nourished. No distress.  HENT:  Head: Normocephalic. Head is with abrasion.    Mouth/Throat: Oropharynx is clear and moist.  Eyes: Conjunctivae and EOM are normal. Pupils are equal, round, and reactive to light.  Neck: Normal range of motion. Neck supple. Muscular tenderness present. No spinous process tenderness present. Normal range of motion present.  Cardiovascular: Normal rate, regular rhythm and intact distal pulses.  No murmur heard. Pulmonary/Chest: Effort normal and breath sounds normal. No respiratory distress. He has no wheezes. He has no rales.  Abdominal: Soft. He exhibits no distension. There is no tenderness. There  is no rebound and no guarding.  Musculoskeletal: Normal range of motion. He exhibits no edema or tenderness.  Abrasion to the right fifth MCP  Neurological: He is alert and oriented to person, place, and time.  She is able to ambulate without difficulty.  Skin: Skin is warm and dry. No rash noted. No erythema.  Psychiatric: He has a normal mood and affect. His behavior is normal.  Nursing note and vitals reviewed.    ED Treatments / Results  Labs (all labs ordered are listed, but only abnormal results are displayed) Labs Reviewed - No data to display  EKG  EKG Interpretation None       Radiology Ct Head Wo  Contrast  Result Date: 02/24/2017 CLINICAL DATA:  Patient status post fall. Forehead and nose lacerations. Headache. EXAM: CT HEAD WITHOUT CONTRAST CT MAXILLOFACIAL WITHOUT CONTRAST CT CERVICAL SPINE WITHOUT CONTRAST TECHNIQUE: Multidetector CT imaging of the head, cervical spine, and maxillofacial structures were performed using the standard protocol without intravenous contrast. Multiplanar CT image reconstructions of the cervical spine and maxillofacial structures were also generated. COMPARISON:  Brain CT 09/12/2014. FINDINGS: CT HEAD FINDINGS Brain: Ventricles and sulci are appropriate for patient's age. Periventricular and subcortical white matter hypodensity compatible with chronic microvascular ischemic changes. 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 No evidence for acute cortically based infarct, intracranial hemorrhage, mass lesion or mass-effect. Vascular: Unremarkable Skull: Intact. Other: None. CT MAXILLOFACIAL FINDINGS Osseous: There is a nondisplaced mildly angulated fracture through the nasal bone (image 58; series 7). No evidence for associated acute maxillofacial fractures. The mandible is intact. Orbits: Negative. No traumatic or inflammatory finding. Sinuses: Clear. Soft tissues: Soft tissue swelling overlying the nose. CT CERVICAL SPINE FINDINGS Alignment: Grade 1 anterolisthesis of C4 on C5, likely secondary to facet degenerative changes. Skull base and vertebrae: Intact. Soft tissues and spinal canal: No prevertebral fluid or swelling. No visible canal hematoma. Disc levels: Multilevel degenerative disc disease most pronounced C6-7 and C7-T1. Anterior endplate osteophytosis. Facet degenerative changes most pronounced C2-3, C3-4 and C4-5. No evidence for acute cervical spine fracture. Upper chest: Unremarkable. Other: None. IMPRESSION: 1. Findings suggestive of nondisplaced nasal bone fracture. 2. No evidence for associated maxillofacial fractures. 3. No acute intracranial  process. 4. No acute cervical spine fracture. Electronically Signed   By: Lovey Newcomer M.D.   On: 02/24/2017 11:59   Ct Cervical Spine Wo Contrast  Result Date: 02/24/2017 CLINICAL DATA:  Patient status post fall. Forehead and nose lacerations. Headache. EXAM: CT HEAD WITHOUT CONTRAST CT MAXILLOFACIAL WITHOUT CONTRAST CT CERVICAL SPINE WITHOUT CONTRAST TECHNIQUE: Multidetector CT imaging of the head, cervical spine, and maxillofacial structures were performed using the standard protocol without intravenous contrast. Multiplanar CT image reconstructions of the cervical spine and maxillofacial structures were also generated. COMPARISON:  Brain CT 09/12/2014. FINDINGS: CT HEAD FINDINGS Brain: Ventricles and sulci are appropriate for patient's age. Periventricular and subcortical white matter hypodensity compatible with chronic microvascular ischemic changes. 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 No evidence for acute cortically based infarct, intracranial hemorrhage, mass lesion or mass-effect. Vascular: Unremarkable Skull: Intact. Other: None. CT MAXILLOFACIAL FINDINGS Osseous: There is a nondisplaced mildly angulated fracture through the nasal bone (image 58; series 7). No evidence for associated acute maxillofacial fractures. The mandible is intact. Orbits: Negative. No traumatic or inflammatory finding. Sinuses: Clear. Soft tissues: Soft tissue swelling overlying the nose. CT CERVICAL SPINE FINDINGS Alignment: Grade 1 anterolisthesis of C4 on C5, likely secondary to facet degenerative changes.  Skull base and vertebrae: Intact. Soft tissues and spinal canal: No prevertebral fluid or swelling. No visible canal hematoma. Disc levels: Multilevel degenerative disc disease most pronounced C6-7 and C7-T1. Anterior endplate osteophytosis. Facet degenerative changes most pronounced C2-3, C3-4 and C4-5. No evidence for acute cervical spine fracture. Upper chest: Unremarkable. Other: None. IMPRESSION: 1.  Findings suggestive of nondisplaced nasal bone fracture. 2. No evidence for associated maxillofacial fractures. 3. No acute intracranial process. 4. No acute cervical spine fracture. Electronically Signed   By: Lovey Newcomer M.D.   On: 02/24/2017 11:59   Ct Maxillofacial Wo Contrast  Result Date: 02/24/2017 CLINICAL DATA:  Patient status post fall. Forehead and nose lacerations. Headache. EXAM: CT HEAD WITHOUT CONTRAST CT MAXILLOFACIAL WITHOUT CONTRAST CT CERVICAL SPINE WITHOUT CONTRAST TECHNIQUE: Multidetector CT imaging of the head, cervical spine, and maxillofacial structures were performed using the standard protocol without intravenous contrast. Multiplanar CT image reconstructions of the cervical spine and maxillofacial structures were also generated. COMPARISON:  Brain CT 09/12/2014. FINDINGS: CT HEAD FINDINGS Brain: Ventricles and sulci are appropriate for patient's age. Periventricular and subcortical white matter hypodensity compatible with chronic microvascular ischemic changes. 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 No evidence for acute cortically based infarct, intracranial hemorrhage, mass lesion or mass-effect. Vascular: Unremarkable Skull: Intact. Other: None. CT MAXILLOFACIAL FINDINGS Osseous: There is a nondisplaced mildly angulated fracture through the nasal bone (image 58; series 7). No evidence for associated acute maxillofacial fractures. The mandible is intact. Orbits: Negative. No traumatic or inflammatory finding. Sinuses: Clear. Soft tissues: Soft tissue swelling overlying the nose. CT CERVICAL SPINE FINDINGS Alignment: Grade 1 anterolisthesis of C4 on C5, likely secondary to facet degenerative changes. Skull base and vertebrae: Intact. Soft tissues and spinal canal: No prevertebral fluid or swelling. No visible canal hematoma. Disc levels: Multilevel degenerative disc disease most pronounced C6-7 and C7-T1. Anterior endplate osteophytosis. Facet degenerative changes  most pronounced C2-3, C3-4 and C4-5. No evidence for acute cervical spine fracture. Upper chest: Unremarkable. Other: None. IMPRESSION: 1. Findings suggestive of nondisplaced nasal bone fracture. 2. No evidence for associated maxillofacial fractures. 3. No acute intracranial process. 4. No acute cervical spine fracture. Electronically Signed   By: Lovey Newcomer M.D.   On: 02/24/2017 11:59    Procedures Procedures (including critical care time)  Medications Ordered in ED Medications - No data to display   Initial Impression / Assessment and Plan / ED Course  I have reviewed the triage vital signs and the nursing notes.  Pertinent labs & imaging results that were available during my care of the patient were reviewed by me and considered in my medical decision making (see chart for details).     Shunt with a mechanical fall at home without LOC.  Patient is not on anticoagulation.  Patient has abrasions and skin avulsion to the nose and forehead.  However there is no tissue to sew back together.  Will do local wound care, Steri-Strips applied for protection and will have to allow wound healing by secondary intention.  CT of the head, face and neck pending.  Patient's tetanus was updated.  12:09 PM Imaging is neg for acute pathology.  Will d/c home.  Final Clinical Impressions(s) / ED Diagnoses   Final diagnoses:  Fall, initial encounter  Contusion of face, initial encounter  Closed fracture of nasal bone, initial encounter    ED Discharge Orders    None       Blanchie Dessert, MD 02/24/17 1222

## 2017-02-24 NOTE — ED Notes (Signed)
Patient transported to CT 

## 2017-02-24 NOTE — ED Notes (Signed)
Patient transported to X-ray 

## 2017-02-24 NOTE — ED Triage Notes (Addendum)
Pt tripped and fell face first on cement. Denies LOC. Pt has large abrasion from upper lip to forehead and lac to bridge of nose. Does not take blood thinners. Denies other pain/injury

## 2017-03-30 DIAGNOSIS — H35373 Puckering of macula, bilateral: Secondary | ICD-10-CM | POA: Diagnosis not present

## 2017-03-30 DIAGNOSIS — H43392 Other vitreous opacities, left eye: Secondary | ICD-10-CM | POA: Diagnosis not present

## 2017-03-30 DIAGNOSIS — H26493 Other secondary cataract, bilateral: Secondary | ICD-10-CM | POA: Diagnosis not present

## 2017-03-30 DIAGNOSIS — Z961 Presence of intraocular lens: Secondary | ICD-10-CM | POA: Diagnosis not present

## 2017-05-17 DIAGNOSIS — G4452 New daily persistent headache (NDPH): Secondary | ICD-10-CM | POA: Diagnosis not present

## 2017-05-17 DIAGNOSIS — L989 Disorder of the skin and subcutaneous tissue, unspecified: Secondary | ICD-10-CM | POA: Diagnosis not present

## 2017-05-21 DIAGNOSIS — R51 Headache: Secondary | ICD-10-CM | POA: Diagnosis not present

## 2017-05-21 DIAGNOSIS — G4452 New daily persistent headache (NDPH): Secondary | ICD-10-CM | POA: Diagnosis not present

## 2017-06-13 DIAGNOSIS — L820 Inflamed seborrheic keratosis: Secondary | ICD-10-CM | POA: Diagnosis not present

## 2017-06-13 DIAGNOSIS — D2239 Melanocytic nevi of other parts of face: Secondary | ICD-10-CM | POA: Diagnosis not present

## 2017-07-12 DIAGNOSIS — R42 Dizziness and giddiness: Secondary | ICD-10-CM | POA: Diagnosis not present

## 2017-07-12 DIAGNOSIS — M542 Cervicalgia: Secondary | ICD-10-CM | POA: Diagnosis not present

## 2017-07-12 DIAGNOSIS — G4489 Other headache syndrome: Secondary | ICD-10-CM | POA: Diagnosis not present

## 2017-07-23 DIAGNOSIS — R51 Headache: Secondary | ICD-10-CM | POA: Diagnosis not present

## 2017-07-23 DIAGNOSIS — G4489 Other headache syndrome: Secondary | ICD-10-CM | POA: Diagnosis not present

## 2017-07-23 DIAGNOSIS — G8929 Other chronic pain: Secondary | ICD-10-CM | POA: Diagnosis not present

## 2017-07-23 DIAGNOSIS — M542 Cervicalgia: Secondary | ICD-10-CM | POA: Diagnosis not present

## 2017-08-30 DIAGNOSIS — H5202 Hypermetropia, left eye: Secondary | ICD-10-CM | POA: Diagnosis not present

## 2017-08-30 DIAGNOSIS — H26491 Other secondary cataract, right eye: Secondary | ICD-10-CM | POA: Diagnosis not present

## 2017-08-30 DIAGNOSIS — H35373 Puckering of macula, bilateral: Secondary | ICD-10-CM | POA: Diagnosis not present

## 2017-08-30 DIAGNOSIS — Z961 Presence of intraocular lens: Secondary | ICD-10-CM | POA: Diagnosis not present

## 2017-11-26 DIAGNOSIS — Z23 Encounter for immunization: Secondary | ICD-10-CM | POA: Diagnosis not present

## 2017-11-28 DIAGNOSIS — R072 Precordial pain: Secondary | ICD-10-CM | POA: Diagnosis not present

## 2017-11-28 DIAGNOSIS — L03012 Cellulitis of left finger: Secondary | ICD-10-CM | POA: Diagnosis not present

## 2017-11-28 DIAGNOSIS — R001 Bradycardia, unspecified: Secondary | ICD-10-CM | POA: Diagnosis not present

## 2018-01-15 IMAGING — CT CT MAXILLOFACIAL W/O CM
3 of 10 series · 12 of 47 positions shown, 15 images · non-contrast
Comparison: Brain CT 09/12/2014.

CLINICAL DATA: Patient status post fall. Forehead and nose
lacerations. Headache.

EXAM:
CT HEAD WITHOUT CONTRAST
CT MAXILLOFACIAL WITHOUT CONTRAST
CT CERVICAL SPINE WITHOUT CONTRAST
TECHNIQUE: Multidetector CT imaging of the head, cervical spine, and
maxillofacial structures were performed using the standard protocol
without intravenous contrast. Multiplanar CT image reconstructions
of the cervical spine and maxillofacial structures were also
generated.

[Series 4: head coronal · coronal · 0.31mm/px · 2 of 70 slices shown]
[im 24/70  bone]
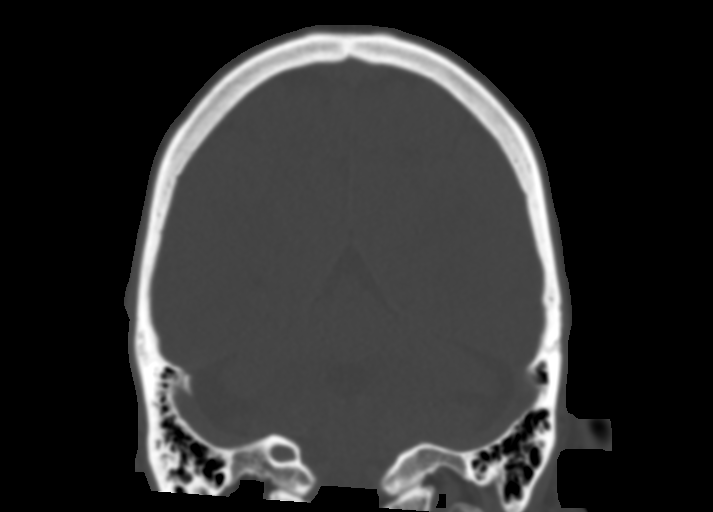
[im 47/70  bone]
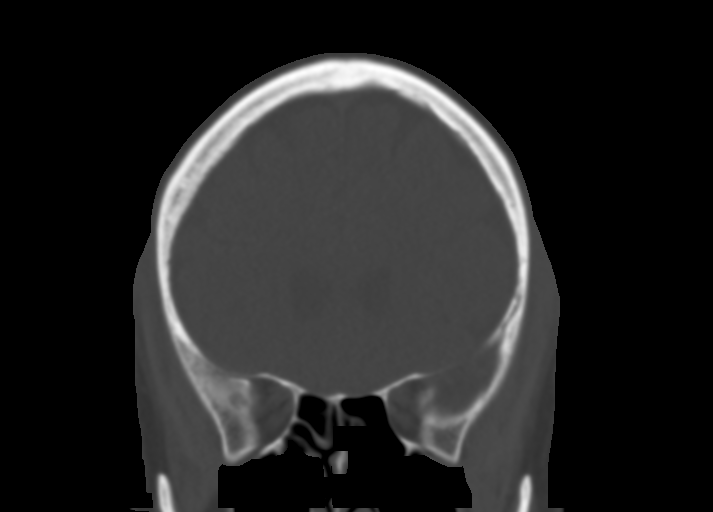

[Series 12: sagittal soft · sagittal · 0.29mm/px · 1 of 93 slices shown]
[im 47/93  bone]
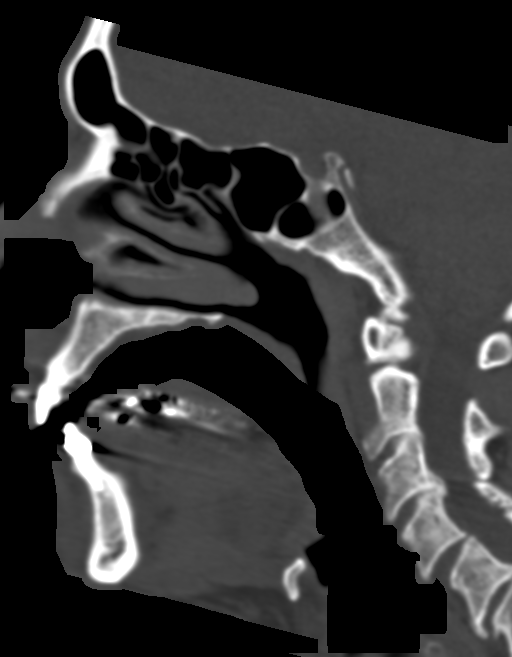

[Series 19: orthogonal axials · axial · 0.26mm/px · z∈[-360,-188]mm · 9 of 124 slices shown, 12 images]
[im 12/124  brain]
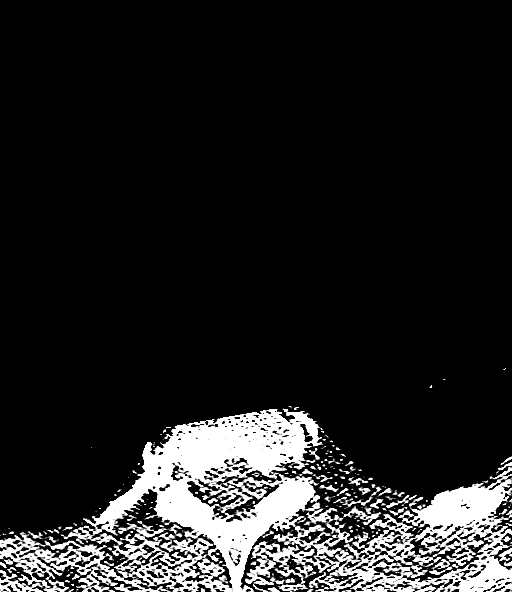
[im 12/124  bone]
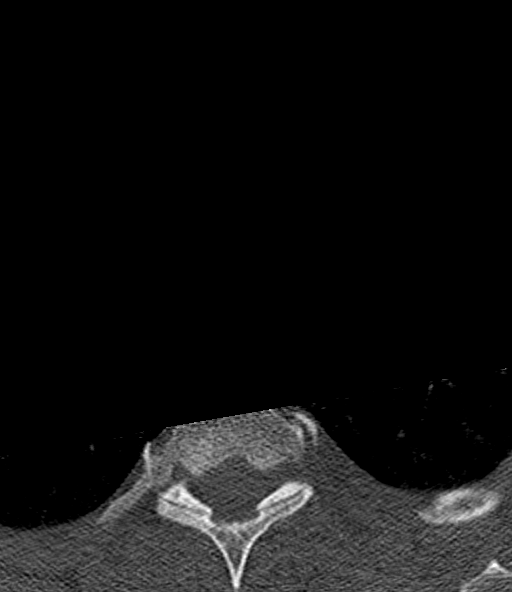
[im 23/124  bone]
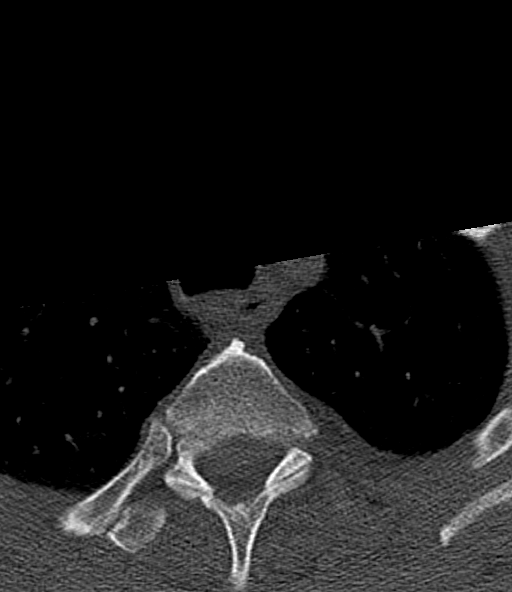
[im 34/124  bone]
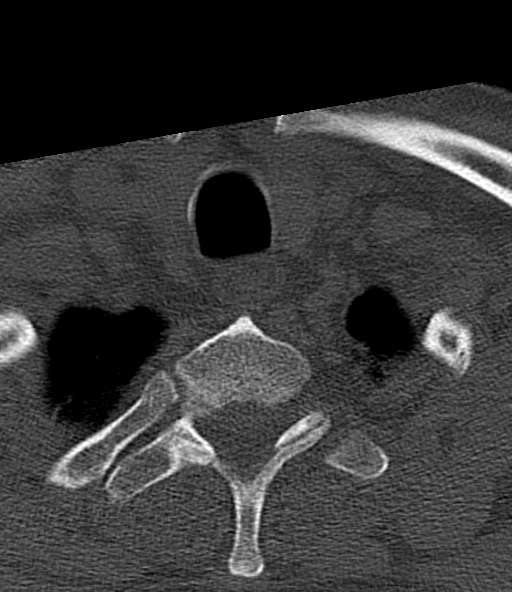
[im 45/124  bone]
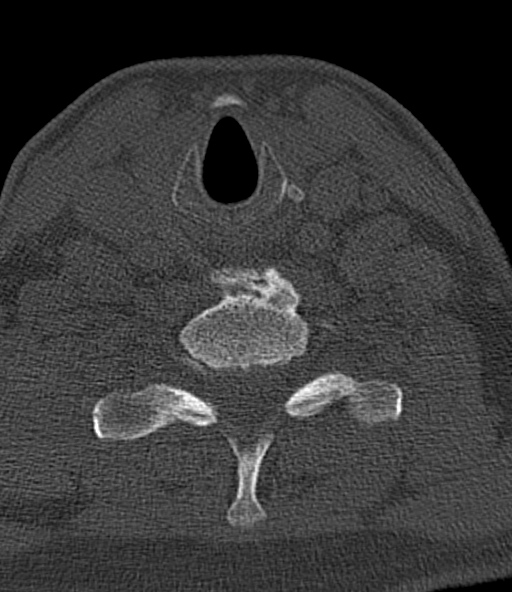
[im 68/124  brain]
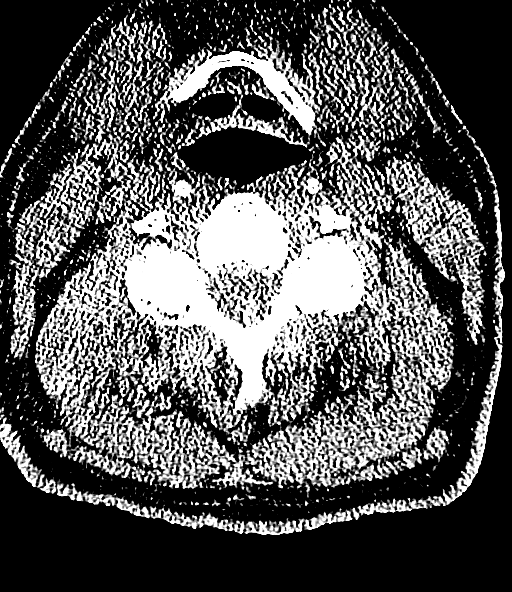
[im 68/124  bone]
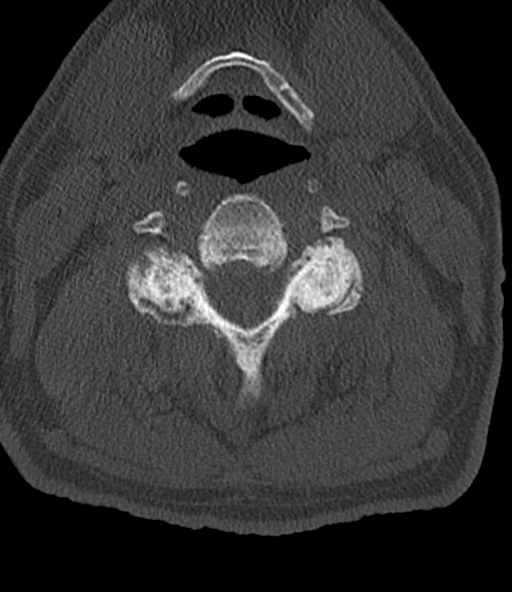
[im 79/124  bone]
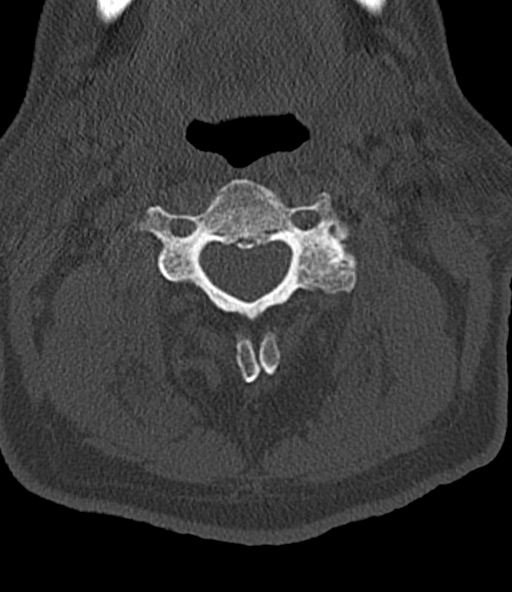
[im 90/124  bone]
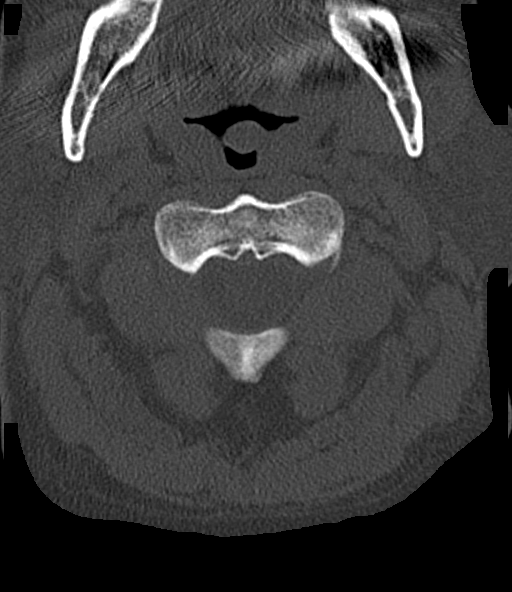
[im 101/124  bone]
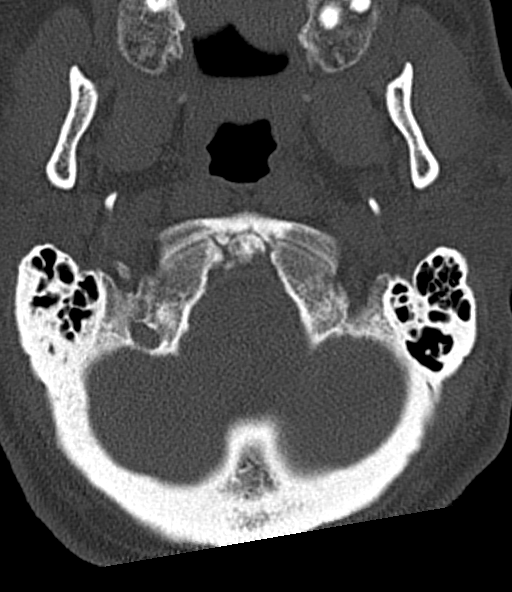
[im 112/124  brain]
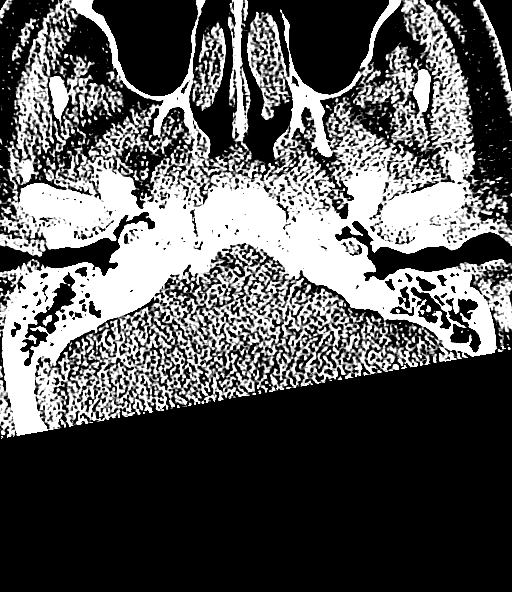
[im 112/124  bone]
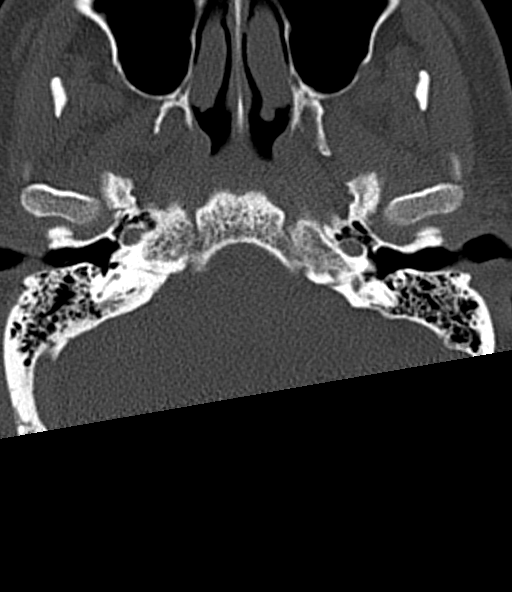

[12 of 47 positions shown; findings below may reference images not displayed]

FINDINGS: CT HEAD FINDINGS

Brain: Ventricles and sulci are appropriate for patient's age.
Periventricular and subcortical white matter hypodensity compatible
with chronic microvascular ischemic changes. Sequelae of remote
infarct within the left parietal lobe. No evidence for acute
cortically based infarct, intracranial hemorrhage, mass lesion or
mass-effect.

Vascular: Unremarkable

Skull: Intact.

Other: None.

CT MAXILLOFACIAL FINDINGS

Osseous: There is a nondisplaced mildly angulated fracture through
the nasal bone (image 58; series 7). No evidence for associated
acute maxillofacial fractures. The mandible is intact.

Orbits: Negative. No traumatic or inflammatory finding.

Sinuses: Clear.

Soft tissues: Soft tissue swelling overlying the nose.

CT CERVICAL SPINE FINDINGS

Alignment: Grade 1 anterolisthesis of C4 on C5, likely secondary to
facet degenerative changes.

Skull base and vertebrae: Intact.

Soft tissues and spinal canal: No prevertebral fluid or swelling. No
visible canal hematoma.

Disc levels: Multilevel degenerative disc disease most pronounced
C6-7 and C7-T1. Anterior endplate osteophytosis. Facet degenerative
changes most pronounced C2-3, C3-4 and C4-5. No evidence for acute
cervical spine fracture.

Upper chest: Unremarkable.

Other: None.
IMPRESSION: 1. Findings suggestive of nondisplaced nasal bone fracture.
2. No evidence for associated maxillofacial fractures.
3. No acute intracranial process.
4. No acute cervical spine fracture.

## 2019-04-06 ENCOUNTER — Ambulatory Visit: Payer: Medicare Other
